# Patient Record
Sex: Male | Born: 1955 | Race: White | Hispanic: No | Marital: Married | Smoking: Current every day smoker
Health system: Southern US, Community
[De-identification: ages and names within clinical notes are randomized; demographics above are authoritative.]

## PROBLEM LIST (undated history)

## (undated) DIAGNOSIS — E119 Type 2 diabetes mellitus without complications: Secondary | ICD-10-CM

---

## 2013-05-22 ENCOUNTER — Ambulatory Visit: Payer: Self-pay | Admitting: Family Medicine

## 2018-02-25 ENCOUNTER — Emergency Department (HOSPITAL_COMMUNITY): Payer: Medicare HMO

## 2018-02-25 ENCOUNTER — Other Ambulatory Visit: Payer: Self-pay

## 2018-02-25 ENCOUNTER — Emergency Department (HOSPITAL_COMMUNITY)
Admission: EM | Admit: 2018-02-25 | Discharge: 2018-02-25 | Disposition: A | Discharge status: Home / Self Care | Payer: Medicare HMO | Attending: Emergency Medicine | Admitting: Emergency Medicine

## 2018-02-25 ENCOUNTER — Encounter (HOSPITAL_COMMUNITY): Payer: Self-pay | Admitting: Emergency Medicine

## 2018-02-25 DIAGNOSIS — F17210 Nicotine dependence, cigarettes, uncomplicated: Secondary | ICD-10-CM | POA: Insufficient documentation

## 2018-02-25 DIAGNOSIS — Y998 Other external cause status: Secondary | ICD-10-CM | POA: Insufficient documentation

## 2018-02-25 DIAGNOSIS — Y92410 Unspecified street and highway as the place of occurrence of the external cause: Secondary | ICD-10-CM | POA: Insufficient documentation

## 2018-02-25 DIAGNOSIS — E119 Type 2 diabetes mellitus without complications: Secondary | ICD-10-CM | POA: Diagnosis not present

## 2018-02-25 DIAGNOSIS — S299XXA Unspecified injury of thorax, initial encounter: Secondary | ICD-10-CM | POA: Diagnosis present

## 2018-02-25 DIAGNOSIS — Y939 Activity, unspecified: Secondary | ICD-10-CM | POA: Diagnosis not present

## 2018-02-25 HISTORY — DX: Type 2 diabetes mellitus without complications: E11.9

## 2018-02-25 MED ORDER — LIDOCAINE 5 % EX PTCH: 1.0000 | MEDICATED_PATCH | CUTANEOUS | 0 refills | Status: AC

## 2018-02-25 MED ORDER — METHOCARBAMOL 500 MG PO TABS: 500.0000 mg | ORAL_TABLET | Freq: Two times a day (BID) | ORAL | 0 refills | Status: AC
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
## 2018-02-25 NOTE — Discharge Instructions (Addendum)
Expect your soreness to increase over the next 2-3 days. Take it easy, but do not lay around too much as this may make any stiffness worse.  Antiinflammatory medications: Take 600 mg of ibuprofen every 6 hours or 440 mg (over the counter dose) to 500 mg (prescription dose) of naproxen every 12 hours for the next 3 days. After this time, these medications may be used as needed for pain. Take these medications with food to avoid upset stomach. Choose only one of these medications, do not take them together.  Tylenol: Should you continue to have additional pain while taking the ibuprofen or naproxen, you may add in tylenol as needed. Your daily total maximum amount of tylenol from all sources should be limited to 4000mg /day for persons without liver problems, or 2000mg /day for those with liver problems. Muscle relaxer: Robaxin is a muscle relaxer and may help loosen stiff muscles. Do not take the Robaxin while driving or performing other dangerous activities.  Lidocaine patches: These are available via either prescription or over-the-counter. The over-the-counter option may be more economical one and are likely just as effective. There are multiple over-the-counter brands, such as Salonpas.  Follow up with a primary care provider for any future management of these complaints.
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
## 2018-02-25 NOTE — ED Notes (Signed)
Patient transported to X-ray 
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
## 2019-12-28 ENCOUNTER — Ambulatory Visit: Payer: Medicare HMO

## 2020-01-08 ENCOUNTER — Ambulatory Visit: Payer: Medicare HMO
